# Patient Record
Sex: Male | Born: 2005 | Race: White | Hispanic: No | Marital: Single | Smoking: Never smoker
Health system: Southern US, Community
[De-identification: ages and names within clinical notes are randomized; demographics above are authoritative.]

## PROBLEM LIST (undated history)

## (undated) DIAGNOSIS — J45998 Other asthma: Secondary | ICD-10-CM

## (undated) DIAGNOSIS — Z3A35 35 weeks gestation of pregnancy: Secondary | ICD-10-CM

## (undated) DIAGNOSIS — J45909 Unspecified asthma, uncomplicated: Secondary | ICD-10-CM

---
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
## 2023-12-08 ENCOUNTER — Encounter (HOSPITAL_BASED_OUTPATIENT_CLINIC_OR_DEPARTMENT_OTHER): Payer: Self-pay

## 2023-12-08 ENCOUNTER — Emergency Department (HOSPITAL_BASED_OUTPATIENT_CLINIC_OR_DEPARTMENT_OTHER)
Admission: EM | Admit: 2023-12-08 | Discharge: 2023-12-08 | Disposition: A | Discharge status: Home / Self Care | Payer: BC Managed Care – PPO | Attending: Emergency Medicine | Admitting: Emergency Medicine

## 2023-12-08 ENCOUNTER — Other Ambulatory Visit: Payer: Self-pay

## 2023-12-08 DIAGNOSIS — K120 Recurrent oral aphthae: Secondary | ICD-10-CM | POA: Diagnosis not present

## 2023-12-08 DIAGNOSIS — J039 Acute tonsillitis, unspecified: Secondary | ICD-10-CM | POA: Insufficient documentation

## 2023-12-08 DIAGNOSIS — J029 Acute pharyngitis, unspecified: Secondary | ICD-10-CM | POA: Diagnosis present

## 2023-12-08 DIAGNOSIS — Z20822 Contact with and (suspected) exposure to covid-19: Secondary | ICD-10-CM | POA: Insufficient documentation

## 2023-12-08 LAB — RESP PANEL BY RT-PCR (RSV, FLU A&B, COVID)  RVPGX2
Influenza A by PCR: NEGATIVE
Influenza B by PCR: NEGATIVE
Resp Syncytial Virus by PCR: NEGATIVE
SARS Coronavirus 2 by RT PCR: NEGATIVE

## 2023-12-08 LAB — GROUP A STREP BY PCR: Group A Strep by PCR: NOT DETECTED

## 2023-12-08 MED ORDER — NAPROXEN 500 MG PO TABS: 500.0000 mg | ORAL_TABLET | Freq: Two times a day (BID) | ORAL | 0 refills | Status: AC

## 2023-12-08 MED ORDER — DEXAMETHASONE 10 MG/ML FOR PEDIATRIC ORAL USE
10.0000 mg | Freq: Once | INTRAMUSCULAR | Status: AC
  Administered 2023-12-08: 10 mg via ORAL
  Filled 2023-12-08: qty 1

## 2023-12-08 MED ORDER — KETOROLAC TROMETHAMINE 30 MG/ML IJ SOLN
30.0000 mg | Freq: Once | INTRAMUSCULAR | Status: AC
  Administered 2023-12-08: 30 mg via INTRAMUSCULAR
  Filled 2023-12-08: qty 1

## 2023-12-08 MED ORDER — CLINDAMYCIN HCL 300 MG PO CAPS: 300.0000 mg | ORAL_CAPSULE | Freq: Three times a day (TID) | ORAL | 0 refills | Status: AC

## 2023-12-08 MED ORDER — CHLORHEXIDINE GLUCONATE 0.12 % MT SOLN: 15.0000 mL | Freq: Two times a day (BID) | OROMUCOSAL | 0 refills | Status: AC

## 2023-12-08 MED ORDER — LIDOCAINE VISCOUS HCL 2 % MT SOLN
15.0000 mL | Freq: Once | OROMUCOSAL | Status: AC
  Administered 2023-12-08: 15 mL via OROMUCOSAL
  Filled 2023-12-08: qty 15

## 2023-12-08 MED ORDER — LIDOCAINE VISCOUS HCL 2 % MT SOLN: 15.0000 mL | Freq: Four times a day (QID) | OROMUCOSAL | 0 refills | Status: AC

## 2023-12-08 MED ORDER — CLINDAMYCIN HCL 150 MG PO CAPS
300.0000 mg | ORAL_CAPSULE | Freq: Once | ORAL | Status: AC
  Administered 2023-12-08: 300 mg via ORAL
  Filled 2023-12-08: qty 2

## 2023-12-08 NOTE — ED Triage Notes (Addendum)
 Pt. states he has had a sore throat and HA since Friday.   Pt has several mouth ulcers that he says are painful Motrin 800mg  given at 1730

## 2023-12-08 NOTE — ED Notes (Signed)
 Reviewed AVS/discharge instruction with patient. Time allotted for and all questions answered. Patient is agreeable for d/c and escorted to ed exit by staff.

## 2023-12-08 NOTE — ED Provider Notes (Signed)
 [place] EMERGENCY DEPARTMENT AT [facility] Provider Note   CSN: [id number] Arrival date & time: 12/08/23  1847     History  Chief Complaint  Patient presents with   Sore Throat    Ricky Ferguson is a 18 y.o. male here for evaluation of sore throat.  Has had chills and fever at home.  Has noted ulcers on the inside of his mouth as well as on his tonsils.  Feels like his comes to the right front tooth are also red.  No history of HSV.  No change in voice.  Hurts to eat and drink no rash to palms or soles.  No nausea, vomiting, diarrhea, neck pain, neck stiffness.  HPI     Home Medications Prior to Admission medications   Medication Sig Start Date End Date Taking? Authorizing Provider  chlorhexidine  (PERIDEX ) 0.12 % solution Use as directed 15 mLs in the mouth or throat 2 (two) times daily. 12/08/23  Yes Mert Dietrick A, PA-C  clindamycin  (CLEOCIN ) 300 MG capsule Take 1 capsule (300 mg total) by mouth 3 (three) times daily for 7 days. 12/08/23 12/15/23 Yes Lil Lepage A, PA-C  magic mouthwash (lidocaine , diphenhydrAMINE, alum & mag hydroxide) suspension Swish and swallow 15 mLs 4 (four) times daily for 7 days. 12/08/23 12/15/23 Yes Chai Routh A, PA-C  naproxen  (NAPROSYN ) 500 MG tablet Take 1 tablet (500 mg total) by mouth 2 (two) times daily. 12/08/23  Yes Carmello Cabiness A, PA-C  HYDROcodone -acetaminophen  (NORCO/VICODIN) 5-325 MG tablet Take 1 tablet by mouth every 6 (six) hours as needed. 06/17/18   Merita Delon POUR, MD      Allergies    Patient has no known allergies.    Review of Systems   Review of Systems  Constitutional:  Positive for chills, fatigue and fever.  HENT:  Positive for mouth sores, sore throat and trouble swallowing. Negative for facial swelling and voice change.   Respiratory: Negative.    Cardiovascular: Negative.   Gastrointestinal: Negative.   Genitourinary: Negative.   Musculoskeletal: Negative.   Skin: Negative.   Neurological:  Negative.   All other systems reviewed and are negative.   Physical Exam Updated Vital Signs BP 130/83   Pulse 95   Temp 98.5 F (36.9 C)   Resp 20   Ht 5' 11 (1.803 m)   Wt 79.4 kg   SpO2 100%   BMI 24.41 kg/m  Physical Exam Vitals and nursing note reviewed.  Constitutional:      General: He is not in acute distress.    Appearance: He is well-developed. He is not ill-appearing, toxic-appearing or diaphoretic.  HENT:     Head: Normocephalic and atraumatic.     Nose: No congestion or rhinorrhea.     Mouth/Throat:     Comments: Posterior pharynx erythematous. Tonsils 3+ bilaterally with erythema, exudate and ulcerations.  Few scattered small aphthous ulcerations to the inside of the mouth. Gingival erythema to right front tooth. No obvious abscess. Tongue midline without angioedema. Eyes:     Pupils: Pupils are equal, round, and reactive to light.  Cardiovascular:     Rate and Rhythm: Normal rate and regular rhythm.  Pulmonary:     Effort: Pulmonary effort is normal. No respiratory distress.  Abdominal:     General: There is no distension.     Palpations: Abdomen is soft.  Musculoskeletal:        General: Normal range of motion.     Cervical back: Normal range of motion and  neck supple.  Skin:    General: Skin is warm and dry.  Neurological:     General: No focal deficit present.     Mental Status: He is alert and oriented to person, place, and time.    ED Results / Procedures / Treatments   Labs (all labs ordered are listed, but only abnormal results are displayed) Labs Reviewed  GROUP A STREP BY PCR  RESP PANEL BY RT-PCR (RSV, FLU A&B, COVID)  [id number]    EKG None  Radiology No results found.  Procedures Procedures    Medications Ordered in ED Medications  ketorolac  (TORADOL ) 30 MG/ML injection 30 mg (30 mg Intramuscular Given 12/08/23 2013)  dexamethasone  (DECADRON ) 10 MG/ML injection for Pediatric ORAL use 10 mg (10 mg Oral Given 12/08/23 2018)   lidocaine  (XYLOCAINE ) 2 % viscous mouth solution 15 mL (15 mLs Mouth/Throat Given 12/08/23 2018)  clindamycin  (CLEOCIN ) capsule 300 mg (300 mg Oral Given 12/08/23 2011)   ED Course/ Medical Decision Making/ A&P    18 year old here for sore throat.  He is afebrile, nonseptic, not ill-appearing however does state he is at fever at home.  He has no neck stiffness or neck rigidity of low suspicion for meningitis.  No change in voice, low suspicion for PTA, RPA.  He has 3+ tonsils bilaterally erythema, exudate and also appears to have small scattered aphthous ulcers in the inside of his mouth.  His tongue is midline.  He has no pooling of secretions.  She has had some gingival hyperplasia and erythema surrounding his right front tooth.  No obvious abscess.  His heart and lungs are clear his abdomen soft, tender.  No history of similar.  Plan on labs and reassess  Labs personally viewed and interpreted:  Strep negative COVID, flu, RSV negative  Given exam will start on antibiotics for tonsillitis.  He has no evidence of PTA, RPA on exam.  Do not feel he needs additional labs or imaging.  Will also start on Magic mouthwash double some control.  With regards to his dentition possibly was gingivitis will start on Peridex .  He was given Toradol  and Decadron  here in the emergency department for pain control.  Tolerating p.o. intake.  No concern for STI.  Will have her follow-up outpatient, return for any worsening symptoms  The patient has been appropriately medically screened and/or stabilized in the ED. I have low suspicion for any other emergent medical condition which would require further screening, evaluation or treatment in the ED or require inpatient management.  Patient is hemodynamically stable and in no acute distress.  Patient able to ambulate in department prior to ED.  Evaluation does not show acute pathology that would require ongoing or additional emergent interventions while in the emergency  department or further inpatient treatment.  I have discussed the diagnosis with the patient and answered all questions.  Pain is been managed while in the emergency department and patient has no further complaints prior to discharge.  Patient is comfortable with plan discussed in room and is stable for discharge at this time.  I have discussed strict return precautions for returning to the emergency department.  Patient was encouraged to follow-up with PCP/specialist refer to at discharge.                                Medical Decision Making Amount and/or Complexity of Data Reviewed Independent Historian: parent External Data Reviewed: labs, radiology  and notes. Labs: ordered. Decision-making details documented in ED Course.  Risk OTC drugs. Prescription drug management. Parenteral controlled substances. Decision regarding hospitalization. Diagnosis or treatment significantly limited by social determinants of health.           Final Clinical Impression(s) / ED Diagnoses Final diagnoses:  Tonsillitis  Aphthous ulcer of mouth    Rx / DC Orders ED Discharge Orders          Ordered    clindamycin  (CLEOCIN ) 300 MG capsule  3 times daily        12/08/23 2017    magic mouthwash (lidocaine , diphenhydrAMINE, alum & mag hydroxide) suspension  4 times daily        12/08/23 2017    naproxen  (NAPROSYN ) 500 MG tablet  2 times daily        12/08/23 2017    chlorhexidine  (PERIDEX ) 0.12 % solution  2 times daily        12/08/23 2017              Endiya Klahr A, PA-C 12/08/23 2053    Ruthe Cornet, DO 12/08/23 2113

## 2023-12-08 NOTE — Discharge Instructions (Addendum)
 Take the antibiotics as prescribed  Use the peridex  mouth wash to help with you gum swelling Magic mough was you may swallow to help with pain Tylenol  for fever Use naproxen  prescribed for pain and fever. Do not take additional ibuprofen containing products with the naproxen   Follow up outpatient  Return for new or worsening symptoms
